# Patient Record
Sex: Male | Born: 1975 | Race: White | Hispanic: No | Marital: Single | Smoking: Never smoker
Health system: Southern US, Community
[De-identification: ages and names within clinical notes are randomized; demographics above are authoritative.]

## PROBLEM LIST (undated history)

## (undated) DIAGNOSIS — S060XAA Concussion with loss of consciousness status unknown, initial encounter: Secondary | ICD-10-CM

## (undated) DIAGNOSIS — E162 Hypoglycemia, unspecified: Secondary | ICD-10-CM

## (undated) DIAGNOSIS — S060X9A Concussion with loss of consciousness of unspecified duration, initial encounter: Secondary | ICD-10-CM

## (undated) DIAGNOSIS — E039 Hypothyroidism, unspecified: Secondary | ICD-10-CM

## (undated) DIAGNOSIS — F909 Attention-deficit hyperactivity disorder, unspecified type: Secondary | ICD-10-CM

## (undated) HISTORY — DX: Attention-deficit hyperactivity disorder, unspecified type: F90.9

## (undated) HISTORY — PX: OTHER SURGICAL HISTORY: SHX169

## (undated) HISTORY — DX: Concussion with loss of consciousness status unknown, initial encounter: S06.0XAA

## (undated) HISTORY — DX: Hypoglycemia, unspecified: E16.2

## (undated) HISTORY — DX: Hypothyroidism, unspecified: E03.9

## (undated) HISTORY — DX: Concussion with loss of consciousness of unspecified duration, initial encounter: S06.0X9A

---

## 2016-09-30 ENCOUNTER — Other Ambulatory Visit: Payer: Self-pay

## 2016-10-19 DIAGNOSIS — I209 Angina pectoris, unspecified: Secondary | ICD-10-CM | POA: Insufficient documentation

## 2016-10-19 DIAGNOSIS — R0609 Other forms of dyspnea: Secondary | ICD-10-CM | POA: Insufficient documentation
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
## 2016-10-20 ENCOUNTER — Encounter (INDEPENDENT_AMBULATORY_CARE_PROVIDER_SITE_OTHER): Payer: Self-pay

## 2016-10-20 ENCOUNTER — Ambulatory Visit (INDEPENDENT_AMBULATORY_CARE_PROVIDER_SITE_OTHER): Payer: BLUE CROSS/BLUE SHIELD | Admitting: Interventional Cardiology

## 2016-10-20 ENCOUNTER — Encounter: Payer: Self-pay | Admitting: Interventional Cardiology

## 2016-10-20 DIAGNOSIS — R0609 Other forms of dyspnea: Secondary | ICD-10-CM | POA: Diagnosis not present

## 2016-10-20 DIAGNOSIS — I209 Angina pectoris, unspecified: Secondary | ICD-10-CM | POA: Diagnosis not present

## 2016-10-20 NOTE — Patient Instructions (Signed)
Medication Instructions:  None  Labwork: None  Testing/Procedures: Your physician has requested that you have an exercise tolerance test. For further information please visit [email or web address]. Please also follow instruction sheet, as given.  Your physician has requested that you have an echocardiogram. Echocardiography is a painless test that uses sound waves to create images of your heart. It provides your doctor with information about the size and shape of your heart and how well your heart's chambers and valves are working. This procedure takes approximately one hour. There are no restrictions for this procedure.   Follow-Up: Your physician recommends that you schedule a follow-up appointment as needed with Dr. Smith.    Any Other Special Instructions Will Be Listed Below (If Applicable).     If you need a refill on your cardiac medications before your next appointment, please call your pharmacy.   

## 2016-10-28 ENCOUNTER — Ambulatory Visit (INDEPENDENT_AMBULATORY_CARE_PROVIDER_SITE_OTHER): Payer: BLUE CROSS/BLUE SHIELD

## 2016-10-28 ENCOUNTER — Other Ambulatory Visit: Payer: Self-pay

## 2016-10-28 ENCOUNTER — Ambulatory Visit (HOSPITAL_COMMUNITY): Payer: BLUE CROSS/BLUE SHIELD | Attending: Cardiology

## 2016-10-28 DIAGNOSIS — I209 Angina pectoris, unspecified: Secondary | ICD-10-CM

## 2016-10-28 LAB — EXERCISE TOLERANCE TEST
CHL RATE OF PERCEIVED EXERTION: 16
CSEPED: 10 min
CSEPEW: 12.7 METS
Exercise duration (sec): 37 s
MPHR: 179 {beats}/min
Peak HR: 162 {beats}/min
Percent HR: 90 %
Rest HR: 49 {beats}/min

## 2016-11-11 ENCOUNTER — Other Ambulatory Visit (HOSPITAL_COMMUNITY): Payer: BLUE CROSS/BLUE SHIELD

## 2016-11-11 ENCOUNTER — Encounter (HOSPITAL_COMMUNITY): Payer: BLUE CROSS/BLUE SHIELD

## 2017-03-16 ENCOUNTER — Encounter: Payer: Self-pay | Admitting: Gastroenterology

## 2017-04-30 ENCOUNTER — Ambulatory Visit: Payer: BLUE CROSS/BLUE SHIELD | Admitting: Nurse Practitioner

## 2017-05-07 ENCOUNTER — Encounter: Payer: Self-pay | Admitting: Nurse Practitioner

## 2017-05-07 ENCOUNTER — Telehealth: Payer: Self-pay

## 2017-05-07 ENCOUNTER — Ambulatory Visit: Payer: BLUE CROSS/BLUE SHIELD | Admitting: Nurse Practitioner

## 2017-05-07 ENCOUNTER — Other Ambulatory Visit: Payer: Self-pay

## 2017-05-07 DIAGNOSIS — K649 Unspecified hemorrhoids: Secondary | ICD-10-CM

## 2017-05-07 DIAGNOSIS — K625 Hemorrhage of anus and rectum: Secondary | ICD-10-CM

## 2017-05-07 MED ORDER — PEG 3350-KCL-NA BICARB-NACL 420 G PO SOLR: 4000.0000 mL | ORAL | 0 refills | Status: DC

## 2017-05-07 NOTE — Patient Instructions (Signed)
1. Continue your suppositories. 2. Try to avoid straining or increased abdominal pressure such as with weight lifting, prolonged sitting (as able).  Try to limit toilet time to 5-10 minutes. 3. Consider possible wet wipes for [employer] cleaning.  Read package directions related to disposal. 4. We will schedule your colonoscopy for you. 5. I recommended hemorrhoid banding if you are a candidate, as per our discussion. 6. Follow-up in 3 months. 7. Call if you have any questions or concerns.

## 2017-05-07 NOTE — Assessment & Plan Note (Signed)
The patient has a chronic history of hemorrhoids.  He is having hemorrhoid symptoms which have worsened over the past 1-1-1/2 years.  This includes rectal pain, itching, irritation.  Worse with dead lift/weight lifting, prolonged sitting with driving to work, prolonged sitting at work.  Denies constipation.  Sits on the toilet for 15-20 minutes.  Suppositories helps somewhat but his symptoms are worsening getting progressively more frequent.  Recommend continue suppositories for now, avoid straining and abdominal pressure including weight lifting, prolonged sitting (as able).  Recommend limit toilet time to 5-10 minutes.  Consider using wet wipes when cleaning to help prevent bleeding.  Follow-up in 2 months.  Colonoscopy as per below, consider hemorrhoid banding if candidate.

## 2017-05-07 NOTE — Progress Notes (Signed)
cc'ed to pcp °

## 2017-05-07 NOTE — Telephone Encounter (Signed)
Pt scheduled his procedure today for 05/29/17 and needs to r/s

## 2017-05-07 NOTE — Telephone Encounter (Signed)
Called pt. Procedure rescheduled to 06/01/17 at 11:15am. New instructions mailed.

## 2017-05-07 NOTE — Assessment & Plan Note (Signed)
History of chronic hemorrhoids which are worsened over the past 1-1-1/2 years.  He has started having some scant toilet tissue hematochezia, worse with wiping after a bowel movement.  Hemorrhoid management as per above.  Given his rectal bleeding and failure of suppositories to continue to control his symptoms we will plan for colonoscopy to further evaluate his bleeding.  He is interested in hemorrhoid banding if he is a candidate.  Return for follow-up in 3 months.  Proceed with colonoscopy +/- hemorrhoid banding with Dr. Darrick PennaFields in the near future. The risks, benefits, and alternatives have been discussed in detail with the patient. They state understanding and desire to proceed.   The patient is not on any anticoagulants, anxiolytics, chronic pain medications, or antidepressants.  Denies alcohol and drug use.  Conscious sedation should be adequate for his procedure.

## 2017-05-07 NOTE — Progress Notes (Signed)
Primary Care Physician:  Samuel Jester, DO Primary Gastroenterologist:  Dr. Darrick Penna  Chief Complaint  Patient presents with  . Hemorrhoids    very sore, no bleeding, some burning    HPI:   Norman Brown is a 42 y.o. male who presents on referral from primary care for hemorrhoids.  Reviewed information included with referral.  Records included office visit dated 03/11/2017 which indicated hemorrhoids with suppositories not working very well.  Recommended referral to GI.  Today he states he's somewhat better than he has been. Worse with lots of driving for work. Hemorrhoid symptoms intermittent for years; just dealt with it and eventually it would go away. More progressive and more frequent over the last 1-1.5 years. Does lift weights and deadlifts worsen his hemorrhoids and he has decreased the amount of weight lifted. Drinks plenty of water, takes in plenty of fiber. Denies constipation unless he eats a lot of cheese. Typically has a bowel movement about daily, soft, passes easily. Typically limits toilet time to 15- 20 minutes. Has had some scant toilet tissue hematochezia when cleaning. Noted difficulty cleaning with hemorrhoid flares. Denies abdominal pain, N/V, melena, fever, chills, unintentional weight loss. Has never had a colonoscopy. Denies chest pain, dyspnea, dizziness, lightheadedness, syncope, near syncope. Denies any other upper or lower GI symptoms.  Past Medical History:  Diagnosis Date  . ADHD   . Hypoglycemia   . Hypothyroidism     Past Surgical History:  Procedure Laterality Date  .  None to date     As of 05/07/17    Current Outpatient Medications  Medication Sig Dispense Refill  . levothyroxine (SYNTHROID, LEVOTHROID) 125 MCG tablet Take 125 mcg by mouth daily. Once a day on empty stomach.    . methylphenidate (RITALIN) 20 MG tablet Take 20 mg by mouth 3 (three) times daily. Take medication on empty stomache     No current facility-administered medications for this  visit.     Allergies as of 05/07/2017 - Review Complete 05/07/2017  Allergen Reaction Noted  . Ceclor [cefaclor] Itching and Rash 10/20/2016    Family History  Problem Relation Age of Onset  . Hypertension Mother   . Hypertension Father   . Diabetes Mellitus I Father   . Hypertension Maternal Grandmother   . Hypertension Maternal Grandfather   . Hypertension Paternal Grandmother   . Heart disease Paternal Grandmother   . Hypertension Paternal Grandfather   . Colon cancer Neg Hx   . Gastric cancer Neg Hx   . Esophageal cancer Neg Hx     Social History   Socioeconomic History  . Marital status: Single    Spouse name: Not on file  . Number of children: Not on file  . Years of education: Not on file  . Highest education level: Not on file  Social Needs  . Financial resource strain: Not on file  . Food insecurity - worry: Not on file  . Food insecurity - inability: Not on file  . Transportation needs - medical: Not on file  . Transportation needs - non-medical: Not on file  Occupational History  . Not on file  Tobacco Use  . Smoking status: Never Smoker  . Smokeless tobacco: Never Used  Substance and Sexual Activity  . Alcohol use: No    Frequency: Never  . Drug use: No  . Sexual activity: Not on file  Other Topics Concern  . Not on file  Social History Narrative  . Not on file  Review of Systems: Complete ROS negative except as per HPI.    Physical Exam: BP 109/64   Pulse (!) 53   Temp (!) 97 F (36.1 C) (Oral)   Ht 5\' 4"  (1.626 m)   Wt 172 lb 6.4 oz (78.2 kg)   BMI 29.59 kg/m  General:   Alert and oriented. Pleasant and cooperative. Well-nourished and well-developed.  Head:  Normocephalic and atraumatic. Eyes:  Without icterus, sclera clear and conjunctiva pink.  Ears:  Normal auditory acuity. Cardiovascular:  S1, S2 present without murmurs appreciated. Extremities without clubbing or edema. Respiratory:  Clear to auscultation bilaterally. No  wheezes, rales, or rhonchi. No distress.  Gastrointestinal:  +BS, soft, non-tender and non-distended. No HSM noted. No guarding or rebound. No masses appreciated.  Rectal:  Deferred  Musculoskalatal:  Symmetrical without gross deformities. Neurologic:  Alert and oriented x4;  grossly normal neurologically. Psych:  Alert and cooperative. Normal mood and affect. Heme/Lymph/Immune: No excessive bruising noted.    05/07/2017 11:28 AM   Disclaimer: This note was dictated with voice recognition software. Similar sounding words can inadvertently be transcribed and may not be corrected upon review.

## 2017-05-08 NOTE — Telephone Encounter (Signed)
Called and informed Endo scheduler of procedure being rescheduled.

## 2017-05-11 ENCOUNTER — Telehealth: Payer: Self-pay

## 2017-05-11 NOTE — Telephone Encounter (Signed)
Called [employer]. No PA needed for TCS-/+hemorrhoid banding. Ref# [id number].

## 2017-05-26 ENCOUNTER — Telehealth: Payer: Self-pay | Admitting: Gastroenterology

## 2017-05-26 NOTE — Telephone Encounter (Signed)
LMOVM will confirm with pt prior to cancelling

## 2017-05-26 NOTE — Telephone Encounter (Signed)
Spoke with pt. He reports the out of pocket cost for the procedure is too expensive. He is wanting to cancel for now. Sending as an Financial plannerYI

## 2017-05-26 NOTE — Telephone Encounter (Signed)
Pt called to cancel his procedure for Monday with SF. He said after talking with his Insurance Co. He doesn't want a large bill.

## 2017-05-29 NOTE — Telephone Encounter (Signed)
Noted  

## 2017-06-01 ENCOUNTER — Encounter (HOSPITAL_COMMUNITY): Admission: RE | Payer: Self-pay | Source: Ambulatory Visit

## 2017-06-01 ENCOUNTER — Ambulatory Visit (HOSPITAL_COMMUNITY)
Admission: RE | Admit: 2017-06-01 | Payer: BLUE CROSS/BLUE SHIELD | Source: Ambulatory Visit | Admitting: Gastroenterology

## 2017-06-01 SURGERY — COLONOSCOPY
Anesthesia: Moderate Sedation

## 2017-08-06 ENCOUNTER — Ambulatory Visit: Payer: BLUE CROSS/BLUE SHIELD | Admitting: Nurse Practitioner

## 2018-03-31 ENCOUNTER — Encounter: Payer: Self-pay | Admitting: Diagnostic Neuroimaging

## 2018-03-31 ENCOUNTER — Ambulatory Visit: Payer: BLUE CROSS/BLUE SHIELD | Admitting: Diagnostic Neuroimaging

## 2018-03-31 DIAGNOSIS — R413 Other amnesia: Secondary | ICD-10-CM
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
## 2018-04-01 LAB — VITAMIN B12

## 2018-04-05 ENCOUNTER — Telehealth: Payer: Self-pay | Admitting: Diagnostic Neuroimaging

## 2018-04-05 DIAGNOSIS — S0540XA Penetrating wound of orbit with or without foreign body, unspecified eye, initial encounter: Secondary | ICD-10-CM

## 2018-04-05 NOTE — Telephone Encounter (Signed)
Patient returned my call he is scheduled for 225/20 at [facility].

## 2018-04-05 NOTE — Telephone Encounter (Signed)
Pt is wanting to r/s MRI to 2/25. Please call to advise

## 2018-04-05 NOTE — Telephone Encounter (Signed)
Pt has called Irving Burton back, he is asking to be called @ [phone]

## 2018-04-05 NOTE — Telephone Encounter (Signed)
I left a vmail for the patient to call me back.

## 2018-04-05 NOTE — Telephone Encounter (Signed)
lvm for pt to call me back 

## 2018-04-05 NOTE — Telephone Encounter (Signed)
MR Brain w/wo contrast Dr. Theresa Mulligan Auth: [id number] (exp. 04/01/18 to 04/30/18). Patient is scheduled at [facility] for 04/20/18.   Patient also informed me he is slightly claustrophobic and would like something to help. Also, he has had metal in his eyes before when you get a chance can you put an xray in.

## 2018-04-07 ENCOUNTER — Encounter: Payer: Self-pay | Admitting: *Deleted

## 2018-04-08 MED ORDER — ALPRAZOLAM 0.5 MG PO TABS: 0.5000 mg | ORAL_TABLET | ORAL | 0 refills | Status: DC | PRN

## 2018-04-08 NOTE — Telephone Encounter (Signed)
Orders placed. -VRP 

## 2018-04-08 NOTE — Telephone Encounter (Signed)
Called patient and informed him Dr Marjory Lies ordered x ray of his orbits and prescribed medication to [facility] for upcoming MRI. Patient verbalized understanding, appreciation.

## 2018-04-08 NOTE — Addendum Note (Signed)
Addended by: Joycelyn Schmid R on: 04/08/2018 01:47 PM   Modules accepted: Orders

## 2018-04-14 ENCOUNTER — Other Ambulatory Visit: Payer: Self-pay | Admitting: Internal Medicine

## 2018-04-14 DIAGNOSIS — R1032 Left lower quadrant pain: Secondary | ICD-10-CM

## 2018-04-16 ENCOUNTER — Ambulatory Visit
Admission: RE | Admit: 2018-04-16 | Discharge: 2018-04-16 | Disposition: A | Discharge status: Home / Self Care | Payer: BLUE CROSS/BLUE SHIELD | Source: Ambulatory Visit | Attending: Internal Medicine | Admitting: Internal Medicine

## 2018-04-16 ENCOUNTER — Other Ambulatory Visit: Payer: Self-pay | Admitting: Diagnostic Neuroimaging

## 2018-04-16 ENCOUNTER — Telehealth: Payer: Self-pay | Admitting: *Deleted

## 2018-04-16 ENCOUNTER — Ambulatory Visit
Admission: RE | Admit: 2018-04-16 | Discharge: 2018-04-16 | Disposition: A | Discharge status: Home / Self Care | Payer: BLUE CROSS/BLUE SHIELD | Source: Ambulatory Visit | Attending: Diagnostic Neuroimaging | Admitting: Diagnostic Neuroimaging

## 2018-04-16 DIAGNOSIS — R1032 Left lower quadrant pain: Secondary | ICD-10-CM

## 2018-04-16 DIAGNOSIS — S0540XA Penetrating wound of orbit with or without foreign body, unspecified eye, initial encounter: Secondary | ICD-10-CM

## 2018-04-16 MED ORDER — IOPAMIDOL (ISOVUE-300) INJECTION 61%
100.0000 mL | Freq: Once | INTRAVENOUS | Status: AC | PRN
  Administered 2018-04-16: 100 mL via INTRAVENOUS

## 2018-04-16 NOTE — Telephone Encounter (Signed)
LVM informing patient the x ray of his orbits showed no metal. Advised he can proceed with MRI next week. Left number for any questions.

## 2018-04-20 ENCOUNTER — Other Ambulatory Visit: Payer: BLUE CROSS/BLUE SHIELD

## 2018-04-27 ENCOUNTER — Ambulatory Visit: Payer: BLUE CROSS/BLUE SHIELD

## 2018-04-27 DIAGNOSIS — R413 Other amnesia: Secondary | ICD-10-CM | POA: Diagnosis not present

## 2018-04-27 MED ORDER — GADOBENATE DIMEGLUMINE 529 MG/ML IV SOLN
15.0000 mL | Freq: Once | INTRAVENOUS | Status: AC | PRN
  Administered 2018-04-27: 15 mL via INTRAVENOUS

## 2018-04-29 NOTE — Progress Notes (Signed)
Spoke to pt and relayed that MRI results unremarkable.  He verbalized understanding.

## 2019-11-27 IMAGING — CT CT ABD-PELV W/ CM
2 of 5 series · 14 of 46 positions shown, 16 images · IV contrast (iopamidol)
Comparison: None.

CLINICAL DATA: Lower abdominal pain

EXAM:
CT ABDOMEN AND PELVIS WITH CONTRAST
TECHNIQUE: Multidetector CT imaging of the abdomen and pelvis was performed
using the standard protocol following bolus administration of
intravenous contrast. Oral contrast was also administered.
CONTRAST:  100mL BMEUIH-WYY IOPAMIDOL (BMEUIH-WYY) INJECTION 61%

[Series 2: abd pelvis 5.00 br40 s3 ax · axial · 0.63mm/px · z∈[+1188,+1588]mm · 11 of 91 slices shown, 13 images]
[im 6/91  soft-tissue]
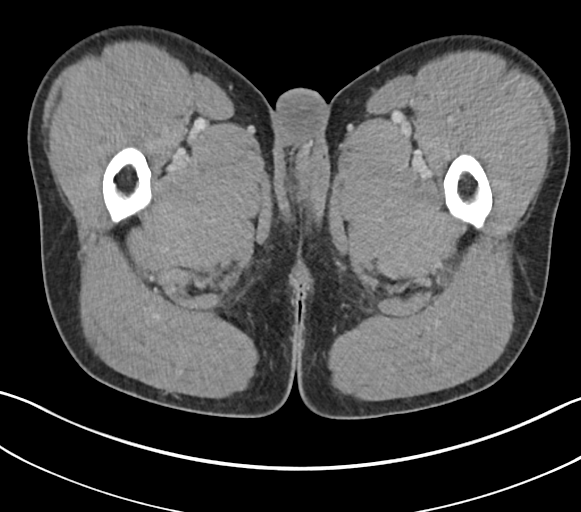
[im 6/91  bone]
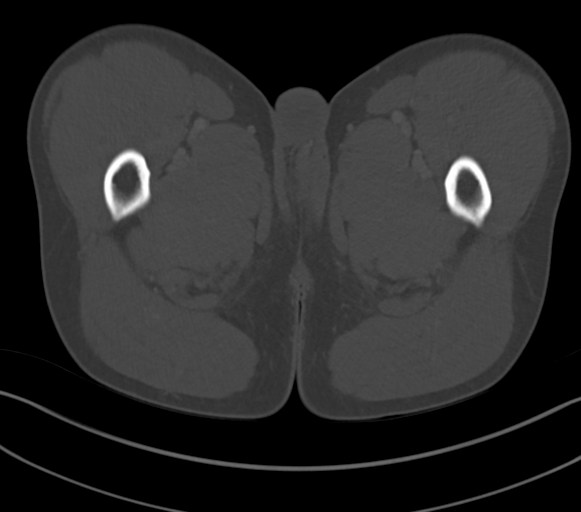
[im 16/91  soft-tissue]
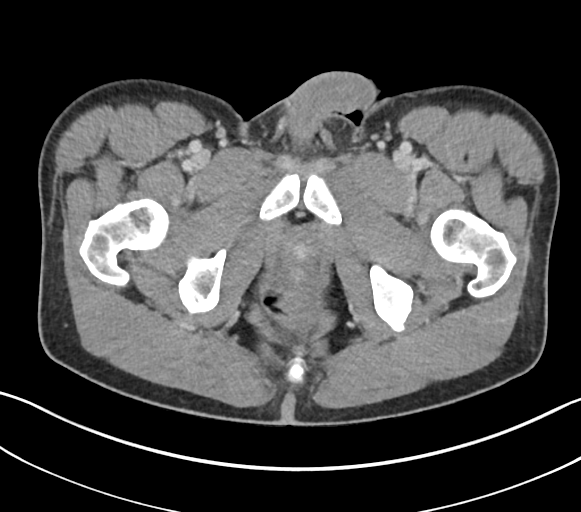
[im 21/91  soft-tissue]
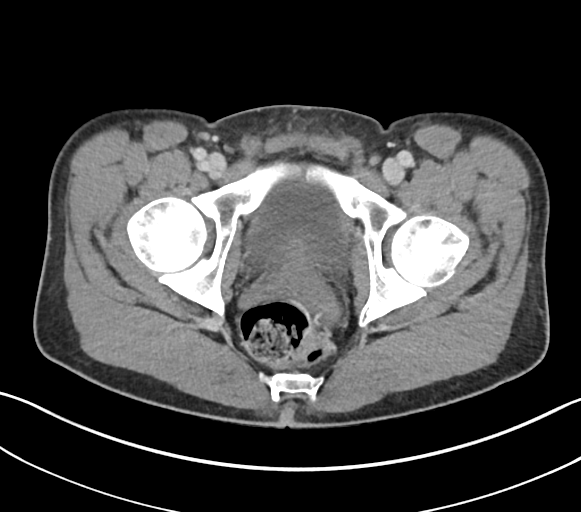
[im 31/91  soft-tissue]
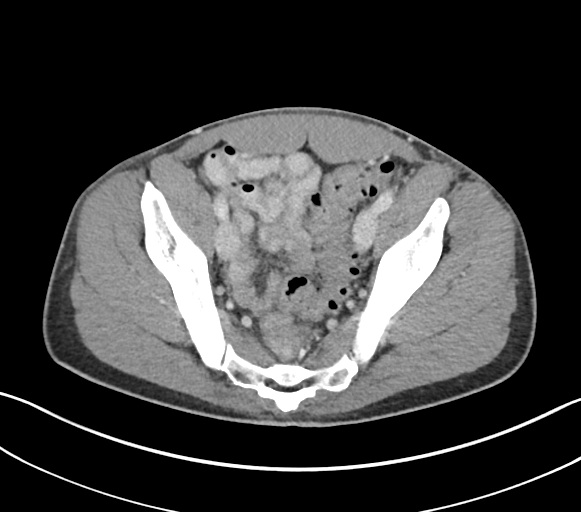
[im 36/91  soft-tissue]
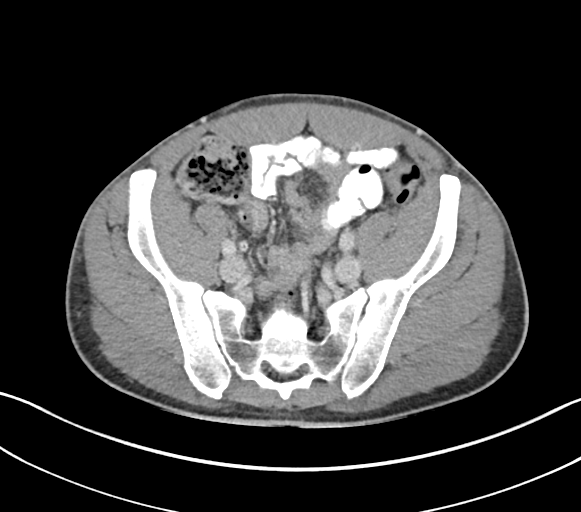
[im 46/91  soft-tissue]
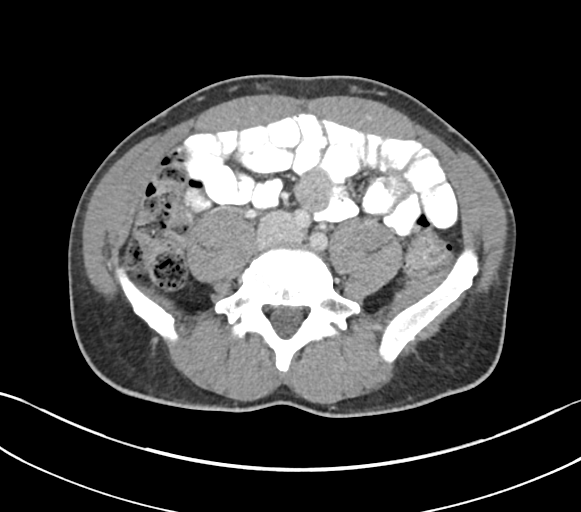
[im 56/91  soft-tissue]
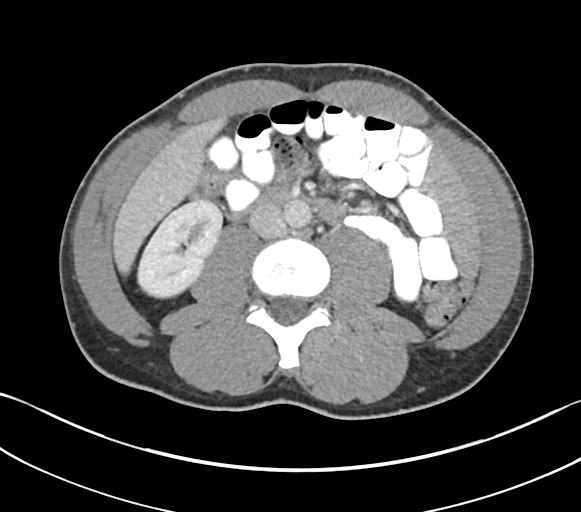
[im 61/91  soft-tissue]
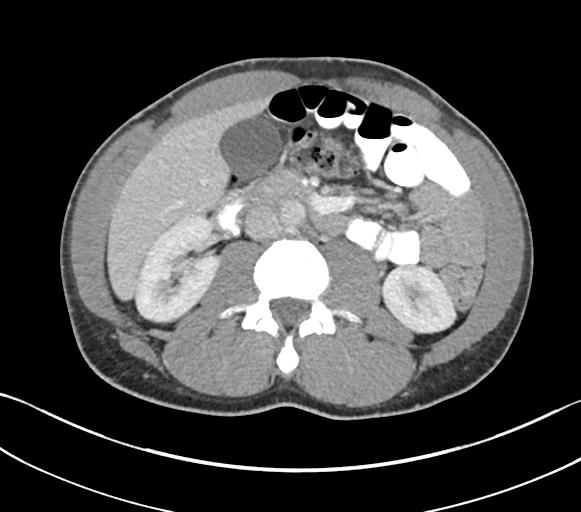
[im 71/91  soft-tissue]
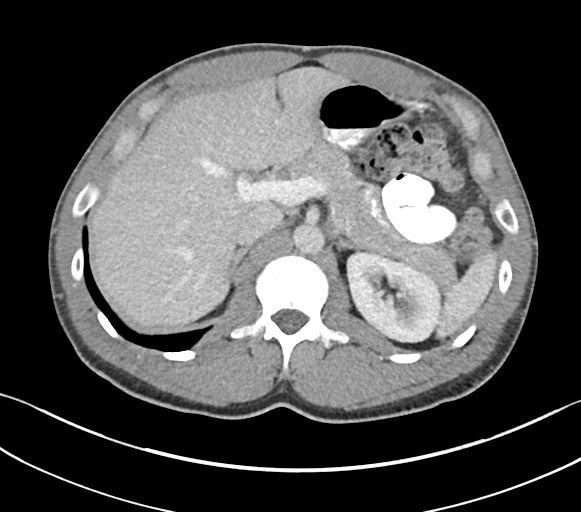
[im 71/91  bone]
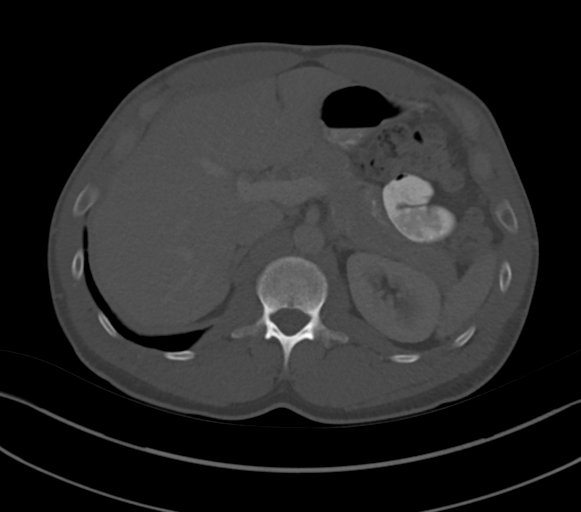
[im 76/91  soft-tissue]
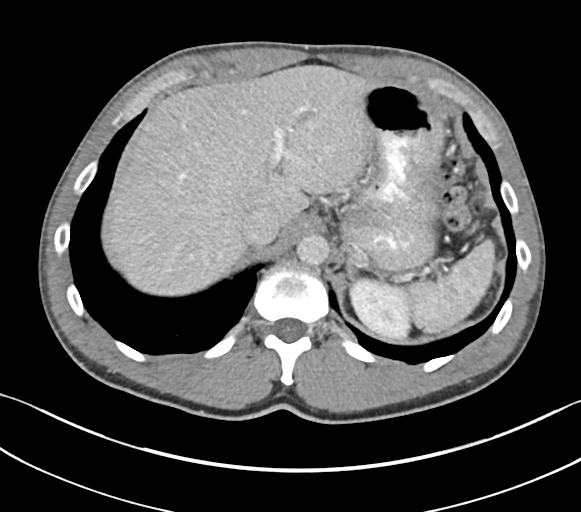
[im 86/91  soft-tissue]
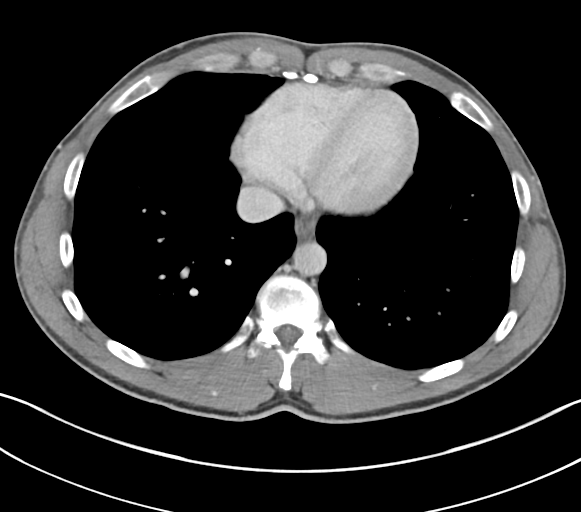

[Series 6: abd pelvis 2.00 br40 s3 cor · coronal · 0.70mm/px · 3 of 135 slices shown]
[im 45/135  soft-tissue]
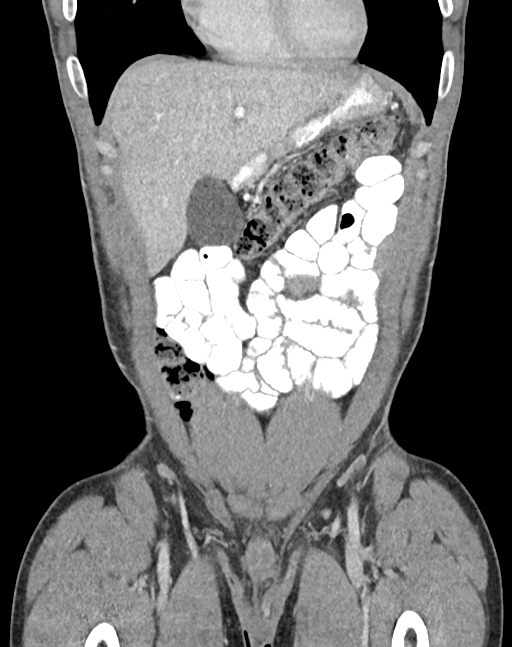
[im 60/135  soft-tissue]
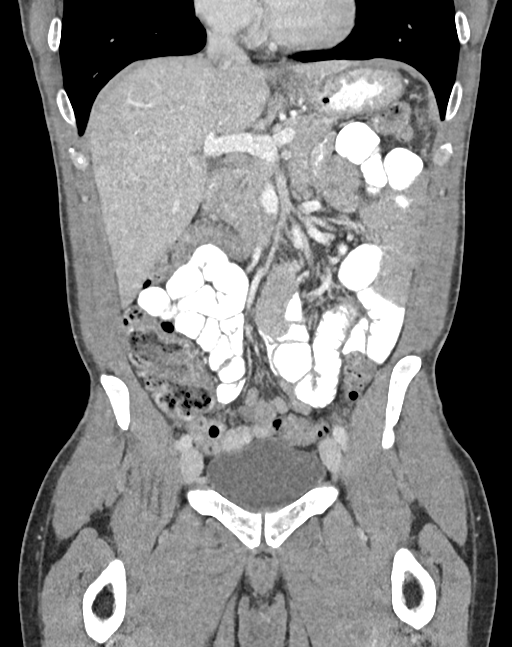
[im 75/135  soft-tissue]
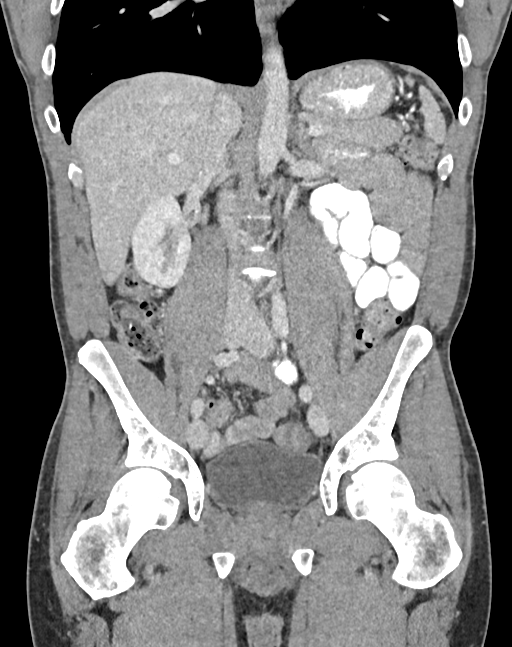

[14 of 46 positions shown; findings below may reference images not displayed]

FINDINGS: Lower chest: Lung bases are clear.

Hepatobiliary: No focal liver lesions are appreciable on this
non-contrast enhanced study. Gallbladder wall is not appreciably
thickened. There is no biliary duct dilatation.

Pancreas: No pancreatic mass or inflammatory focus.

Spleen: No splenic lesions are evident.

Adrenals/Urinary Tract: Adrenals bilaterally appear unremarkable.
Kidneys bilaterally show no evident mass or hydronephrosis on either
side. There is no appreciable renal or ureteral calculus on either
side. Urinary bladder is midline with wall thickness within normal
limits.

Stomach/Bowel: There are scattered descending colonic and sigmoid
diverticula without diverticulitis. There is no appreciable bowel
wall or mesenteric thickening. There is no evident bowel
obstruction. There is no free air or portal venous air.

Vascular/Lymphatic: No abdominal aortic aneurysm. No vascular
lesions are evident. No adenopathy is appreciable in the abdomen or
pelvis.

Reproductive: There are several prostatic calculi. Prostate and
seminal vesicles are normal in size and contour. There is no evident
pelvic mass.

Other: The appendix appears normal. There is no appreciable abscess
or ascites in the abdomen or pelvis.

Musculoskeletal: There are no blastic or lytic bone lesions. There
is no intramuscular or abdominal wall lesion evident.
IMPRESSION: 1. Descending colonic and sigmoid diverticulosis. No diverticulitis
is appreciable by CT. No evident bowel obstruction.

2. No demonstrable abscess in the abdomen or pelvis. Appendix
appears normal.

3. No appreciable renal or ureteral calculus. No hydronephrosis.
Urinary bladder wall thickness is within normal limits. Several
prostatic calculi noted.

## 2021-11-07 ENCOUNTER — Encounter: Payer: Self-pay | Admitting: Orthopaedic Surgery

## 2021-11-07 ENCOUNTER — Ambulatory Visit (INDEPENDENT_AMBULATORY_CARE_PROVIDER_SITE_OTHER): Payer: BC Managed Care – PPO | Admitting: Orthopaedic Surgery

## 2021-11-07 VITALS — BP 132/88 | HR 53 | Ht 64.0 in | Wt 160.5 lb

## 2021-11-07 DIAGNOSIS — R29898 Other symptoms and signs involving the musculoskeletal system: Secondary | ICD-10-CM

## 2021-11-07 DIAGNOSIS — R202 Paresthesia of skin: Secondary | ICD-10-CM

## 2021-11-07 DIAGNOSIS — M25521 Pain in right elbow: Secondary | ICD-10-CM | POA: Diagnosis not present

## 2021-11-07 DIAGNOSIS — M79641 Pain in right hand: Secondary | ICD-10-CM | POA: Diagnosis not present

## 2021-11-07 DIAGNOSIS — M79642 Pain in left hand: Secondary | ICD-10-CM

## 2021-11-07 DIAGNOSIS — M25522 Pain in left elbow: Secondary | ICD-10-CM

## 2021-11-07 NOTE — Patient Instructions (Addendum)
We are referring you to [facility] from [facility] [facility] address is [address] [place] The phone number is [phone]  The office will call you with an appointment Dr. Naaman Plummer will do your nerve study his office will call you with appointment   Use Aspercreme, Biofreeze or Voltaren gel over the counter 2-3 times daily for your elbow pain make sure you rub it in well each time you use it (several minutes) Ice after exercise may also help with the elbow pain

## 2021-11-07 NOTE — Progress Notes (Signed)
Subjective:    Patient ID: Norman Brown, male    DOB: [birth year], 46 y.o.   MRN: [id number]  HPI He has been having pain in both hands with numbness and dropping things for many months.  He has no trauma.  He has nocturnal numbness as well.  It is not getting any better.  He has to shake his hands now during the day when they go numb.  He had been doing that at night when his hands went numb.  He has no trauma, no redness, no swelling.  He has more problem on the right dominant hand.  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  He has problems holding a wrench and doing fine things.  He has no neck pain.  He also has some pain of the elbows, more on the right.  The pain is in the posterior portion.  He has no redness, no swelling.  The pain is worse after a hard workout.  He has cut back on his hard workouts and the pain is much better.   Review of Systems  Constitutional:  Positive for activity change.  Musculoskeletal:  Positive for arthralgias and myalgias.  All other systems reviewed and are negative. For Review of Systems, all other systems reviewed and are negative.  The following is a summary of the past history medically, past history surgically, known current medicines, social history and family history.  This information is gathered electronically by the computer from prior information and documentation.  I review this each visit and have found including this information at this point in the chart is beneficial and informative.   Past Medical History:  Diagnosis Date   ADHD    Concussion    multiple times, sport injuries   Hypoglycemia    Hypothyroidism     Past Surgical History:  Procedure Laterality Date    None to date     As of 05/07/17   broken elbow     x 2, sport injuries   broken hand     and foot. sport injuries    Current Outpatient Medications on File Prior to Visit  Medication Sig Dispense Refill   Amino Acids  (AMINO ACID PROTEIN PO) Take 2 tablets by mouth 3 (three) times daily. Desiccated Liver Amino Acids     levothyroxine (SYNTHROID, LEVOTHROID) 125 MCG tablet Take 125 mcg by mouth daily before breakfast. Once a day on empty stomach.      magnesium oxide (MAG-OX) 400 MG tablet Take 400 mg by mouth 2 (two) times daily.     Melatonin 5 MG TABS Take 5 mg by mouth at bedtime.     methylphenidate (RITALIN) 20 MG tablet Take 20 mg by mouth 3 (three) times daily. Take medication on empty stomache     Multiple Vitamin (MULTIVITAMIN WITH MINERALS) TABS tablet Take 1 tablet by mouth daily.     Zinc 50 MG TABS Take 50 mg by mouth 2 (two) times daily.     ALPRAZolam (XANAX) 0.5 MG tablet Take 1 tablet (0.5 mg total) by mouth as needed for anxiety (for sedation before MRI scan; take 1 hour before scan; may repeat 15 min before scan). 3 tablet 0   Sodium Chloride, Hypertonic, (MURO 128 OP) Place 1 drop into both eyes daily.     No current facility-administered medications on file prior to visit.    Social History   Socioeconomic History  Marital status: Single    Spouse name: Not on file   Number of children: Not on file   Years of education: Not on file   Highest education level: Not on file  Occupational History   Not on file  Tobacco Use   Smoking status: Never   Smokeless tobacco: Never  Substance and Sexual Activity   Alcohol use: No   Drug use: Never   Sexual activity: Not on file  Other Topics Concern   Not on file  Social History Narrative   Lives home with [place] and 3 kids.  Works at [employer].  Education: Masters Artist.  Caffeine 80 mg in am.   Social Determinants of Health   Financial Resource Strain: Not on file  Food Insecurity: Not on file  Transportation Needs: Not on file  Physical Activity: Not on file  Stress: Not on file  Social Connections: Not on file  Intimate Partner Violence: Not on file    Family History  Problem Relation Age of Onset    Hypertension Mother    Diabetes Mother    Hypothyroidism Mother    Hypertension Father    Diabetes Mellitus I Father    Hypertension Maternal Grandmother    Hypertension Maternal Grandfather    Dementia Maternal Grandfather    Hypertension Paternal Grandmother    Heart disease Paternal Grandmother    Hypertension Paternal Grandfather    Lung cancer Maternal Uncle    Dementia Maternal Uncle    Dementia Maternal Uncle    Dementia Maternal Aunt    Heart disease Paternal Uncle    Colon cancer Neg Hx    Gastric cancer Neg Hx    Esophageal cancer Neg Hx     BP 132/88   Pulse (!) 53   Ht 5\' 4"  (1.626 m)   Wt 160 lb 8 oz (72.8 kg)   BMI 27.55 kg/m   Body mass index is 27.55 kg/m.      Objective:   Physical Exam Vitals and nursing note reviewed. Exam conducted with a chaperone present.  Constitutional:      Appearance: He is well-developed.  HENT:     Head: Normocephalic and atraumatic.  Eyes:     Conjunctiva/sclera: Conjunctivae normal.     Pupils: Pupils are equal, round, and reactive to light.  Cardiovascular:     Rate and Rhythm: Normal rate and regular rhythm.  Pulmonary:     Effort: Pulmonary effort is normal.  Abdominal:     Palpations: Abdomen is soft.  Musculoskeletal:       Arms:       Hands:     Cervical back: Normal range of motion and neck supple.  Skin:    General: Skin is warm and dry.  Neurological:     Mental Status: He is alert and oriented to person, place, and time.     Cranial Nerves: No cranial nerve deficit.     Motor: No abnormal muscle tone.     Coordination: Coordination normal.     Deep Tendon Reflexes: Reflexes are normal and symmetric. Reflexes normal.  Psychiatric:        Behavior: Behavior normal.        Thought Content: Thought content normal.        Judgment: Judgment normal.           Assessment & Plan:   Encounter Diagnoses  Name Primary?   Paresthesia of hand, bilateral Yes   Decreased grip strength  Bilateral elbow joint pain    Bilateral hand pain    I am concerned about bilateral carpal tunnel, more on the right.  I will get EMGs.  I will see him back in two weeks.  Call if any problem.  Precautions discussed.  Electronically Signed Sanjuana Kava, MD 9/7/202310:36 AM

## 2021-11-22 ENCOUNTER — Ambulatory Visit (INDEPENDENT_AMBULATORY_CARE_PROVIDER_SITE_OTHER): Payer: BC Managed Care – PPO | Admitting: Physical Medicine and Rehabilitation

## 2021-11-22 ENCOUNTER — Encounter: Payer: Self-pay | Admitting: Physical Medicine and Rehabilitation

## 2021-11-22 DIAGNOSIS — R202 Paresthesia of skin: Secondary | ICD-10-CM

## 2021-11-22 NOTE — Progress Notes (Unsigned)
Numbness and tingling in bil hands

## 2021-11-24 NOTE — Progress Notes (Signed)
Norman Brown - 46 y.o. male MRN [id number]  Date of birth: Oct 02, 1975  Office Visit Note: Visit Date: 11/22/2021 PCP: Hart Robinsons, DO Referred by: Darreld Mclean, MD  Subjective: Chief Complaint  Patient presents with   Right Hand - Numbness   Left Hand - Numbness   HPI:  Norman Brown is a 46 y.o. male who comes in today at the request of Dr. Darreld Mclean for electrodiagnostic study of the Bilateral upper extremities.  Patient is Right hand dominant.  He reports a chronic several year history of pain numbness and tingling in both hands really right equal to left.  He sees Dr. Marjory Lies and neurology for history of head injury and sports injury and tingling numbness.  No prior electrodiagnostic studies.  Patient is also hypothyroid on Synthroid.  He does not have diabetes.  No frank radicular symptoms.  Symptoms worsening over the last several months.   ROS Otherwise per HPI.  Assessment & Plan: Visit Diagnoses:    ICD-10-CM   1. 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   **The mild slowing of both ulnar nerves at the wrist seems to be more in line with temperature artifact and cold hands.  There is no significant electrodiagnostic evidence of any other focal nerve entrapment, brachial plexopathy or cervical radiculopathy.   Recommendations: 1.  Follow-up with referring physician. 2.  Continue current management of symptoms. 3.  Continue use of resting splint at night-time and as needed during the day. 4.  Suggest surgical evaluation.  Meds & Orders: No orders of the defined types were placed in this encounter.   Orders Placed This Encounter  Procedures   NCV with EMG (electromyography)    Follow-up: Return for Darreld Mclean,  MD as scheduled.   Procedures: No procedures performed  EMG & NCV Findings: Evaluation of the left median motor and the right median motor nerves showed prolonged distal onset latency (L6.0, R7.0 ms) and decreased conduction velocity (Elbow-Wrist, L47, R45 m/s).  The left median (across palm) sensory nerve showed prolonged distal peak latency (Wrist, 5.9 ms) and prolonged distal peak latency (Palm, 6.8 ms).  The right median (across palm) sensory nerve showed no response (Palm) and prolonged distal peak latency (8.4 ms).  The left ulnar sensory and the right ulnar sensory nerves showed prolonged distal peak latency (L4.4, R4.2 ms) and decreased conduction velocity (Wrist-5th Digit, L32, R33 m/s).  All remaining nerves (as indicated in the following tables) were within normal limits.  Left vs. Right side comparison data for the median motor nerve indicates abnormal L-R latency difference (1.0 ms).  All remaining left vs. right side differences were within normal limits.    All examined muscles (as indicated in the following table) showed no evidence of electrical instability.    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   **The mild slowing of both ulnar nerves at the wrist seems to be more in line with temperature artifact and cold hands.  There is no significant electrodiagnostic evidence of any other focal nerve entrapment, brachial plexopathy or cervical radiculopathy.   Recommendations: 1.  Follow-up with referring physician. 2.  Continue current management  of symptoms. 3.  Continue use of resting splint at night-time and as needed during the day. 4.  Suggest surgical evaluation.  ___________________________ Naaman Plummer FAAPMR Board Certified, American Board of Physical Medicine and Rehabilitation    Nerve Conduction  Studies Anti Sensory Summary Table   Stim Site NR Peak (ms) Norm Peak (ms) P-T Amp (V) Norm P-T Amp Site1 Site2 Delta-P (ms) Dist (cm) Vel (m/s) Norm Vel (m/s)  Left Median Acr Palm Anti Sensory (2nd Digit)  30.9C  Wrist    *5.9 <3.6 21.6 >10 Wrist Palm 0.9 0.0    Palm    *6.8 <2.0 4.2         Right Median Acr Palm Anti Sensory (2nd Digit)  30.9C  Wrist    *8.4 <3.6 13.7 >10 Wrist Palm  0.0    Palm *NR  <2.0          Left Radial Anti Sensory (Base 1st Digit)  31.5C  Wrist    2.7 <3.1 24.7  Wrist Base 1st Digit 2.7 0.0    Right Radial Anti Sensory (Base 1st Digit)  31.6C  Wrist    2.3 <3.1 25.8  Wrist Base 1st Digit 2.3 0.0    Left Ulnar Anti Sensory (5th Digit)  31.3C  Wrist    *4.4 <3.7 16.7 >15.0 Wrist 5th Digit 4.4 14.0 *32 >38  Right Ulnar Anti Sensory (5th Digit)  31.4C  Wrist    *4.2 <3.7 19.3 >15.0 Wrist 5th Digit 4.2 14.0 *33 >38   Motor Summary Table   Stim Site NR Onset (ms) Norm Onset (ms) O-P Amp (mV) Norm O-P Amp Site1 Site2 Delta-0 (ms) Dist (cm) Vel (m/s) Norm Vel (m/s)  Left Median Motor (Abd Poll Brev)  31.5C  Wrist    *6.0 <4.2 6.9 >5 Elbow Wrist 4.3 20.3 *47 >50  Elbow    10.3  6.7         Right Median Motor (Abd Poll Brev)  31.7C  Wrist    *7.0 <4.2 6.1 >5 Elbow Wrist 4.5 20.3 *45 >50  Elbow    11.5  6.0         Left Ulnar Motor (Abd Dig Min)  31.6C  Wrist    4.0 <4.2 9.6 >3 B Elbow Wrist 3.6 19.5 54 >53  B Elbow    7.6  8.8  A Elbow B Elbow 1.8 10.0 56 >53  A Elbow    9.4  9.0         Right Ulnar Motor (Abd Dig Min)  31.7C  Wrist    3.8 <4.2 12.1 >3 B Elbow Wrist 3.5 19.5 56 >53  B Elbow    7.3  12.0  A Elbow B Elbow 1.6 10.0 62 >53  A Elbow    8.9  11.9          EMG   Side Muscle Nerve Root Ins Act Fibs Psw Amp Dur Poly Recrt Int Dennie Bible Comment  Right Abd Poll Brev Median C8-T1 Nml Nml Nml Nml Nml 0 Nml Nml   Right 1stDorInt Ulnar C8-T1 Nml Nml Nml Nml Nml 0 Nml Nml   Right PronatorTeres Median C6-7 Nml Nml Nml Nml Nml 0 Nml Nml   Right Biceps  Musculocut C5-6 Nml Nml Nml Nml Nml 0 Nml Nml   Right Deltoid Axillary C5-6 Nml Nml Nml Nml Nml 0 Nml Nml     Nerve Conduction Studies Anti Sensory Left/Right Comparison   Stim Site L Lat (ms) R Lat (ms) L-R  Lat (ms) L Amp (V) R Amp (V) L-R Amp (%) Site1 Site2 L Vel (m/s) R Vel (m/s) L-R Vel (m/s)  Median Acr Palm Anti Sensory (2nd Digit)  30.9C  Wrist *5.9 *8.4 2.5 21.6 13.7 36.6 Wrist Palm     Palm *6.8   4.2         Radial Anti Sensory (Base 1st Digit)  31.5C  Wrist 2.7 2.3 0.4 24.7 25.8 4.3 Wrist Base 1st Digit     Ulnar Anti Sensory (5th Digit)  31.3C  Wrist *4.4 *4.2 0.2 16.7 19.3 13.5 Wrist 5th Digit *32 *33 1   Motor Left/Right Comparison   Stim Site L Lat (ms) R Lat (ms) L-R Lat (ms) L Amp (mV) R Amp (mV) L-R Amp (%) Site1 Site2 L Vel (m/s) R Vel (m/s) L-R Vel (m/s)  Median Motor (Abd Poll Brev)  31.5C  Wrist *6.0 *7.0 *1.0 6.9 6.1 11.6 Elbow Wrist *47 *45 2  Elbow 10.3 11.5 1.2 6.7 6.0 10.4       Ulnar Motor (Abd Dig Min)  31.6C  Wrist 4.0 3.8 0.2 9.6 12.1 20.7 B Elbow Wrist 54 56 2  B Elbow 7.6 7.3 0.3 8.8 12.0 26.7 A Elbow B Elbow 56 62 6  A Elbow 9.4 8.9 0.5 9.0 11.9 24.4          Waveforms:                    Clinical History: No specialty comments available.     Objective:  VS:  HT:    WT:   BMI:     BP:   HR: bpm  TEMP: ( )  RESP:  Physical Exam Musculoskeletal:        General: No tenderness.     Comments: Inspection reveals no atrophy of the bilateral APB or FDI or hand intrinsics. There is no swelling, color changes, allodynia or dystrophic changes. There is 5 out of 5 strength in the bilateral wrist extension, finger abduction and long finger flexion. There is intact sensation to light touch in all dermatomal and peripheral nerve distributions. There is a negative Hoffmann's test bilaterally.  Skin:    General: Skin is warm and dry.     Findings: No erythema or rash.  Neurological:     General: No focal deficit present.     Mental  Status: He is alert and oriented to person, place, and time.     Sensory: No sensory deficit.     Motor: No weakness or abnormal muscle tone.     Coordination: Coordination normal.     Gait: Gait normal.  Psychiatric:        Mood and Affect: Mood normal.        Behavior: Behavior normal.        Thought Content: Thought content normal.      Imaging: No results found.

## 2021-11-25 NOTE — Procedures (Signed)
EMG & NCV Findings: Evaluation of the left median motor and the right median motor nerves showed prolonged distal onset latency (L6.0, R7.0 ms) and decreased conduction velocity (Elbow-Wrist, L47, R45 m/s).  The left median (across palm) sensory nerve showed prolonged distal peak latency (Wrist, 5.9 ms) and prolonged distal peak latency (Palm, 6.8 ms).  The right median (across palm) sensory nerve showed no response (Palm) and prolonged distal peak latency (8.4 ms).  The left ulnar sensory and the right ulnar sensory nerves showed prolonged distal peak latency (L4.4, R4.2 ms) and decreased conduction velocity (Wrist-5th Digit, L32, R33 m/s).  All remaining nerves (as indicated in the following tables) were within normal limits.  Left vs. Right side comparison data for the median motor nerve indicates abnormal L-R latency difference (1.0 ms).  All remaining left vs. right side differences were within normal limits.    All examined muscles (as indicated in the following table) showed no evidence of electrical instability.    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   **The mild slowing of both ulnar nerves at the wrist seems to be more in line with temperature artifact and cold hands.  There is no significant electrodiagnostic evidence of any other focal nerve entrapment, brachial plexopathy or cervical radiculopathy.   Recommendations: 1.  Follow-up with referring physician. 2.  Continue current management of symptoms. 3.  Continue use of resting splint at night-time and as needed during the day. 4.  Suggest surgical evaluation.  ___________________________ Laurence Spates FAAPMR Board Certified, American Board of Physical Medicine and Rehabilitation    Nerve Conduction Studies Anti Sensory Summary Table   Stim Site NR Peak (ms) Norm  Peak (ms) P-T Amp (V) Norm P-T Amp Site1 Site2 Delta-P (ms) Dist (cm) Vel (m/s) Norm Vel (m/s)  Left Median Acr Palm Anti Sensory (2nd Digit)  30.9C  Wrist    *5.9 <3.6 21.6 >10 Wrist Palm 0.9 0.0    Palm    *6.8 <2.0 4.2         Right Median Acr Palm Anti Sensory (2nd Digit)  30.9C  Wrist    *8.4 <3.6 13.7 >10 Wrist Palm  0.0    Palm *NR  <2.0          Left Radial Anti Sensory (Base 1st Digit)  31.5C  Wrist    2.7 <3.1 24.7  Wrist Base 1st Digit 2.7 0.0    Right Radial Anti Sensory (Base 1st Digit)  31.6C  Wrist    2.3 <3.1 25.8  Wrist Base 1st Digit 2.3 0.0    Left Ulnar Anti Sensory (5th Digit)  31.3C  Wrist    *4.4 <3.7 16.7 >15.0 Wrist 5th Digit 4.4 14.0 *32 >38  Right Ulnar Anti Sensory (5th Digit)  31.4C  Wrist    *4.2 <3.7 19.3 >15.0 Wrist 5th Digit 4.2 14.0 *33 >38   Motor Summary Table   Stim Site NR Onset (ms) Norm Onset (ms) O-P Amp (mV) Norm O-P Amp Site1 Site2 Delta-0 (ms) Dist (cm) Vel (m/s) Norm Vel (m/s)  Left Median Motor (Abd Poll Brev)  31.5C  Wrist    *6.0 <4.2 6.9 >5 Elbow Wrist 4.3 20.3 *47 >50  Elbow    10.3  6.7         Right Median Motor (Abd Poll Brev)  31.7C  Wrist    *7.0 <4.2 6.1 >5  Elbow Wrist 4.5 20.3 *45 >50  Elbow    11.5  6.0         Left Ulnar Motor (Abd Dig Min)  31.6C  Wrist    4.0 <4.2 9.6 >3 B Elbow Wrist 3.6 19.5 54 >53  B Elbow    7.6  8.8  A Elbow B Elbow 1.8 10.0 56 >53  A Elbow    9.4  9.0         Right Ulnar Motor (Abd Dig Min)  31.7C  Wrist    3.8 <4.2 12.1 >3 B Elbow Wrist 3.5 19.5 56 >53  B Elbow    7.3  12.0  A Elbow B Elbow 1.6 10.0 62 >53  A Elbow    8.9  11.9          EMG   Side Muscle Nerve Root Ins Act Fibs Psw Amp Dur Poly Recrt Int Fraser Din Comment  Right Abd Poll Brev Median C8-T1 Nml Nml Nml Nml Nml 0 Nml Nml   Right 1stDorInt Ulnar C8-T1 Nml Nml Nml Nml Nml 0 Nml Nml   Right PronatorTeres Median C6-7 Nml Nml Nml Nml Nml 0 Nml Nml   Right Biceps Musculocut C5-6 Nml Nml Nml Nml Nml 0 Nml Nml   Right Deltoid  Axillary C5-6 Nml Nml Nml Nml Nml 0 Nml Nml     Nerve Conduction Studies Anti Sensory Left/Right Comparison   Stim Site L Lat (ms) R Lat (ms) L-R Lat (ms) L Amp (V) R Amp (V) L-R Amp (%) Site1 Site2 L Vel (m/s) R Vel (m/s) L-R Vel (m/s)  Median Acr Palm Anti Sensory (2nd Digit)  30.9C  Wrist *5.9 *8.4 2.5 21.6 13.7 36.6 Wrist Palm     Palm *6.8   4.2         Radial Anti Sensory (Base 1st Digit)  31.5C  Wrist 2.7 2.3 0.4 24.7 25.8 4.3 Wrist Base 1st Digit     Ulnar Anti Sensory (5th Digit)  31.3C  Wrist *4.4 *4.2 0.2 16.7 19.3 13.5 Wrist 5th Digit *32 *33 1   Motor Left/Right Comparison   Stim Site L Lat (ms) R Lat (ms) L-R Lat (ms) L Amp (mV) R Amp (mV) L-R Amp (%) Site1 Site2 L Vel (m/s) R Vel (m/s) L-R Vel (m/s)  Median Motor (Abd Poll Brev)  31.5C  Wrist *6.0 *7.0 *1.0 6.9 6.1 11.6 Elbow Wrist *47 *45 2  Elbow 10.3 11.5 1.2 6.7 6.0 10.4       Ulnar Motor (Abd Dig Min)  31.6C  Wrist 4.0 3.8 0.2 9.6 12.1 20.7 B Elbow Wrist 54 56 2  B Elbow 7.6 7.3 0.3 8.8 12.0 26.7 A Elbow B Elbow 56 62 6  A Elbow 9.4 8.9 0.5 9.0 11.9 24.4          Waveforms:

## 2021-11-26 ENCOUNTER — Encounter: Payer: Self-pay | Admitting: Orthopaedic Surgery

## 2021-11-26 ENCOUNTER — Ambulatory Visit: Payer: BC Managed Care – PPO | Admitting: Orthopaedic Surgery

## 2021-11-26 DIAGNOSIS — G5603 Carpal tunnel syndrome, bilateral upper limbs: Secondary | ICD-10-CM

## 2021-11-26 NOTE — Progress Notes (Signed)
My hands are still numb.  He had the EMGs done by Dr. Ernestina Patches showing abnormal study with bilateral carpal tunnel syndrome.  He still has median nerve numbness.  I have recommended carpal tunnel release.  I will have him see Dr. Aline Brochure or Dr. Amedeo Kinsman.  He has positive Phalen and Tinel bilaterally.  Decreased sensation median nerve bilaterally.  Encounter Diagnosis  Name Primary?   Bilateral carpal tunnel syndrome Yes   To see Dr. Lemmie Evens or Dr. Loletha Grayer.  Call if any problem.  Precautions discussed.  Electronically Signed Sanjuana Kava, MD 9/26/20238:35 AM

## 2021-11-26 NOTE — Patient Instructions (Signed)
Please schedule patient to see Dr. Aline Brochure or Dr.Cairns for a CTR consult.

## 2021-11-29 ENCOUNTER — Encounter: Payer: Self-pay | Admitting: Orthopedic Surgery

## 2021-11-29 ENCOUNTER — Ambulatory Visit (INDEPENDENT_AMBULATORY_CARE_PROVIDER_SITE_OTHER): Payer: BC Managed Care – PPO | Admitting: Orthopedic Surgery

## 2021-11-29 DIAGNOSIS — Z01818 Encounter for other preprocedural examination: Secondary | ICD-10-CM

## 2021-11-29 DIAGNOSIS — G5601 Carpal tunnel syndrome, right upper limb: Secondary | ICD-10-CM | POA: Diagnosis not present

## 2021-11-29 NOTE — Progress Notes (Unsigned)
New Patient Visit  Assessment: Norman Brown is a 46 y.o. male with the following: 1. Carpal tunnel syndrome, right upper limb  Plan: Reef Achterberg Caudell has bilateral carpal tunnel syndrome.  This has been going on for years.  EMG demonstrates moderate carpal tunnel.  Right is worse than left.  He has tried medicines and bracing.  He notes numbness and tingling every night.  He is interested in proceeding with surgery.    Risks and benefits of the surgery, including, but not limited to infection, bleeding, persistent pain, need for further surgery, recurrence of symptoms, damage to nerves, persistent numbness and more severe complications associated with anesthesia were discussed with the patient.  The patient has elected to proceed.  All questions have been answered.   Plan for surgery 12/12/2021   Follow-up: Return for After surgery; DOS 10/12, After surgery.  Subjective:  Chief Complaint  Patient presents with   Carpal Tunnel    Referred by Dr.Keeling//discuss CTR R>L   New Patient (Initial Visit)    History of Present Illness: Norman Brown is a 46 y.o. male who presents for evaluation of bilateral hand pain.  He has had burning pain, numbness and tingling in both hands for years.  Right is worse than left.  Medicines have not helped.  Bracing has not been effective.  He is constantly shaking out his hands.   He has obtained EMG for both hands and is interested in discussing surgery.    Review of Systems: No fevers or chills + numbness or tingling No chest pain No shortness of breath No bowel or bladder dysfunction No GI distress No headaches   Medical History:  Past Medical History:  Diagnosis Date   ADHD    Concussion    multiple times, sport injuries   Hypoglycemia    Hypothyroidism     Past Surgical History:  Procedure Laterality Date    None to date     As of 05/07/17   broken elbow     x 2, sport injuries   broken hand     and foot. sport  injuries    Family History  Problem Relation Age of Onset   Hypertension Mother    Diabetes Mother    Hypothyroidism Mother    Hypertension Father    Diabetes Mellitus I Father    Hypertension Maternal Grandmother    Hypertension Maternal Grandfather    Dementia Maternal Grandfather    Hypertension Paternal Grandmother    Heart disease Paternal Grandmother    Hypertension Paternal Grandfather    Lung cancer Maternal Uncle    Dementia Maternal Uncle    Dementia Maternal Uncle    Dementia Maternal Aunt    Heart disease Paternal Uncle    Colon cancer Neg Hx    Gastric cancer Neg Hx    Esophageal cancer Neg Hx    Social History   Tobacco Use   Smoking status: Never   Smokeless tobacco: Never  Substance Use Topics   Alcohol use: No   Drug use: Never    Allergies  Allergen Reactions   Ceclor [Cefaclor] Itching and Rash    Current Meds  Medication Sig   methylphenidate (RITALIN) 20 MG tablet Take 20 mg by mouth 3 (three) times daily. Take medication on empty stomache   SYNTHROID 137 MCG tablet Take 137 mcg by mouth daily.   trimethoprim-polymyxin b (POLYTRIM) ophthalmic solution SMARTSIG:In Eye(s)    Objective: There were no vitals taken for this visit.  Physical Exam:  General: Alert and oriented. and No acute distress. Gait: Normal gait.  Bilateral hands without deformity.  No atrophy.  + Tinel's. +Phalens.  +CTC test.  Good grip strength.    IMAGING: No new imaging obtained today  Bilateral hand:  The above electrodiagnostic study is ABNORMAL and reveals evidence of a moderate bilateral median nerve entrapment at the wrist (carpal tunnel syndrome) affecting sensory and motor components.    New Medications:  No orders of the defined types were placed in this encounter.     Mordecai Rasmussen, MD  11/29/2021 10:05 AM

## 2021-11-30 ENCOUNTER — Encounter: Payer: Self-pay | Admitting: Orthopedic Surgery

## 2021-12-04 ENCOUNTER — Telehealth: Payer: Self-pay | Admitting: Orthopedic Surgery

## 2021-12-04 NOTE — Telephone Encounter (Signed)
Call per voice message (call returned to acknowledge receipt of message) - patient states he needs to cancel the surgery scheduled 12/12/21; said 'needs to get the money figured out, and that copay on his plan is much higher than expected. States he is planning to reschedule, possibly around the first of the new year. Please call back to patient [phone]

## 2021-12-05 NOTE — Telephone Encounter (Signed)
Called pt who states he still wants and need the surgery but would like to get things more organized before hand financially. Let pt know that we will keep him on our list and to call or send a MyChart message when he's ready to proceed.

## 2021-12-10 ENCOUNTER — Encounter (HOSPITAL_COMMUNITY): Payer: BC Managed Care – PPO

## 2021-12-12 ENCOUNTER — Encounter (HOSPITAL_COMMUNITY): Admission: RE | Payer: Self-pay | Source: Ambulatory Visit

## 2021-12-12 ENCOUNTER — Ambulatory Visit (HOSPITAL_COMMUNITY)
Admission: RE | Admit: 2021-12-12 | Payer: BC Managed Care – PPO | Source: Ambulatory Visit | Admitting: Orthopedic Surgery

## 2021-12-12 SURGERY — CARPAL TUNNEL RELEASE
Anesthesia: Choice | Laterality: Right

## 2022-05-01 ENCOUNTER — Encounter: Payer: Self-pay | Admitting: Radiology

## 2023-10-23 ENCOUNTER — Encounter: Payer: Self-pay | Admitting: Radiology

## 2024-01-04 ENCOUNTER — Encounter: Payer: Self-pay | Admitting: Radiology
